# Patient Record
Sex: Male | Born: 1960 | Race: White | Hispanic: No | Marital: Single | Smoking: Never smoker
Health system: Southern US, Academic
[De-identification: ages and names within clinical notes are randomized; demographics above are authoritative.]

---

## 2022-02-19 ENCOUNTER — Emergency Department (HOSPITAL_COMMUNITY): Payer: Worker's Comp, Other unspecified | Admitting: Radiology

## 2022-02-19 ENCOUNTER — Emergency Department (EMERGENCY_DEPARTMENT_HOSPITAL): Payer: Worker's Comp, Other unspecified

## 2022-02-19 ENCOUNTER — Emergency Department (EMERGENCY_DEPARTMENT_HOSPITAL): Payer: Worker's Comp, Other unspecified | Admitting: Radiology

## 2022-02-19 ENCOUNTER — Emergency Department (HOSPITAL_COMMUNITY): Payer: Worker's Comp, Other unspecified

## 2022-02-19 ENCOUNTER — Inpatient Hospital Stay
Admission: EM | Admit: 2022-02-19 | Discharge: 2022-02-23 | DRG: 199 | Disposition: A | Discharge status: Home / Self Care | Payer: Worker's Comp, Other unspecified | Source: Ambulatory Visit | Attending: SURGICAL CRITICAL CARE | Admitting: SURGICAL CRITICAL CARE

## 2022-02-19 ENCOUNTER — Inpatient Hospital Stay (HOSPITAL_COMMUNITY): Payer: Self-pay | Admitting: Surgical Critical Care

## 2022-02-19 DIAGNOSIS — S27321A Contusion of lung, unilateral, initial encounter: Secondary | ICD-10-CM | POA: Diagnosis present

## 2022-02-19 DIAGNOSIS — I10 Essential (primary) hypertension: Secondary | ICD-10-CM | POA: Diagnosis present

## 2022-02-19 DIAGNOSIS — S270XXA Traumatic pneumothorax, initial encounter: Principal | ICD-10-CM | POA: Diagnosis present

## 2022-02-19 DIAGNOSIS — S59901A Unspecified injury of right elbow, initial encounter: Secondary | ICD-10-CM

## 2022-02-19 DIAGNOSIS — S272XXA Traumatic hemopneumothorax, initial encounter: Secondary | ICD-10-CM

## 2022-02-19 DIAGNOSIS — X58XXXA Exposure to other specified factors, initial encounter: Secondary | ICD-10-CM

## 2022-02-19 DIAGNOSIS — S0083XA Contusion of other part of head, initial encounter: Secondary | ICD-10-CM

## 2022-02-19 DIAGNOSIS — E041 Nontoxic single thyroid nodule: Secondary | ICD-10-CM

## 2022-02-19 DIAGNOSIS — S2241XA Multiple fractures of ribs, right side, initial encounter for closed fracture: Secondary | ICD-10-CM

## 2022-02-19 DIAGNOSIS — R220 Localized swelling, mass and lump, head: Secondary | ICD-10-CM

## 2022-02-19 DIAGNOSIS — S299XXA Unspecified injury of thorax, initial encounter: Secondary | ICD-10-CM

## 2022-02-19 DIAGNOSIS — S4991XA Unspecified injury of right shoulder and upper arm, initial encounter: Secondary | ICD-10-CM

## 2022-02-19 DIAGNOSIS — S8991XA Unspecified injury of right lower leg, initial encounter: Secondary | ICD-10-CM

## 2022-02-19 DIAGNOSIS — S3993XA Unspecified injury of pelvis, initial encounter: Secondary | ICD-10-CM

## 2022-02-19 DIAGNOSIS — H0589 Other disorders of orbit: Secondary | ICD-10-CM

## 2022-02-19 DIAGNOSIS — Q283 Other malformations of cerebral vessels: Secondary | ICD-10-CM

## 2022-02-19 DIAGNOSIS — J939 Pneumothorax, unspecified: Secondary | ICD-10-CM

## 2022-02-19 DIAGNOSIS — J982 Interstitial emphysema: Secondary | ICD-10-CM

## 2022-02-19 DIAGNOSIS — R918 Other nonspecific abnormal finding of lung field: Secondary | ICD-10-CM

## 2022-02-19 DIAGNOSIS — S3991XA Unspecified injury of abdomen, initial encounter: Secondary | ICD-10-CM

## 2022-02-19 DIAGNOSIS — S0003XA Contusion of scalp, initial encounter: Secondary | ICD-10-CM

## 2022-02-19 DIAGNOSIS — J439 Emphysema, unspecified: Secondary | ICD-10-CM

## 2022-02-19 DIAGNOSIS — S14109A Unspecified injury at unspecified level of cervical spinal cord, initial encounter: Secondary | ICD-10-CM

## 2022-02-19 DIAGNOSIS — N2889 Other specified disorders of kidney and ureter: Secondary | ICD-10-CM

## 2022-02-19 DIAGNOSIS — Z978 Presence of other specified devices: Secondary | ICD-10-CM

## 2022-02-19 DIAGNOSIS — K760 Fatty (change of) liver, not elsewhere classified: Secondary | ICD-10-CM

## 2022-02-19 DIAGNOSIS — S0101XA Laceration without foreign body of scalp, initial encounter: Secondary | ICD-10-CM

## 2022-02-19 DIAGNOSIS — J9811 Atelectasis: Secondary | ICD-10-CM

## 2022-02-19 DIAGNOSIS — S225XXA Flail chest, initial encounter for closed fracture: Secondary | ICD-10-CM | POA: Diagnosis present

## 2022-02-19 LAB — CBC WITH DIFF
BASOPHIL #: <0.1 10*3/uL (ref <=0.20)
BASOPHIL #: <0.1 10*3/uL (ref <=0.20)
BASOPHIL %: 1 %
BASOPHIL %: 0 %
EOSINOPHIL #: <0.1 10*3/uL (ref <=0.50)
EOSINOPHIL #: <0.1 10*3/uL (ref <=0.50)
EOSINOPHIL %: 1 %
EOSINOPHIL %: 0 %
HCT: 44.9 % (ref 38.9–52.0)
HCT: 38.7 % — ABNORMAL LOW (ref 38.9–52.0)
HGB: 14.7 g/dL (ref 13.4–17.5)
HGB: 13.1 g/dL — ABNORMAL LOW (ref 13.4–17.5)
IMMATURE GRANULOCYTE #: 0.12 10*3/uL — ABNORMAL HIGH (ref <0.10)
IMMATURE GRANULOCYTE #: <0.1 10*3/uL (ref <0.10)
IMMATURE GRANULOCYTE %: 1 % (ref 0–1)
IMMATURE GRANULOCYTE %: 1 % (ref 0–1)
LYMPHOCYTE #: 1.93 10*3/uL (ref 1.00–4.80)
LYMPHOCYTE #: 1.13 10*3/uL (ref 1.00–4.80)
LYMPHOCYTE %: 14 %
LYMPHOCYTE %: 10 %
MCH: 30.7 pg (ref 26.0–32.0)
MCH: 31.4 pg (ref 26.0–32.0)
MCHC: 32.7 g/dL (ref 31.0–35.5)
MCHC: 33.9 g/dL (ref 31.0–35.5)
MCV: 93.7 fL (ref 78.0–100.0)
MCV: 92.8 fL (ref 78.0–100.0)
MONOCYTE #: 1.18 10*3/uL — ABNORMAL HIGH (ref 0.20–1.10)
MONOCYTE #: 1.13 10*3/uL — ABNORMAL HIGH (ref 0.20–1.10)
MONOCYTE %: 8 %
MONOCYTE %: 10 %
MPV: 9.7 fL (ref 8.7–12.5)
MPV: 9.6 fL (ref 8.7–12.5)
NEUTROPHIL #: 10.69 10*3/uL — ABNORMAL HIGH (ref 1.50–7.70)
NEUTROPHIL #: 9.5 10*3/uL — ABNORMAL HIGH (ref 1.50–7.70)
NEUTROPHIL %: 75 %
NEUTROPHIL %: 79 %
PLATELETS: 199 10*3/uL (ref 150–400)
PLATELETS: 177 10*3/uL (ref 150–400)
RBC: 4.79 10*6/uL (ref 4.50–6.10)
RBC: 4.17 10*6/uL — ABNORMAL LOW (ref 4.50–6.10)
RDW-CV: 12.5 % (ref 11.5–15.5)
RDW-CV: 12.5 % (ref 11.5–15.5)
WBC: 14.1 10*3/uL — ABNORMAL HIGH (ref 3.7–11.0)
WBC: 11.9 10*3/uL — ABNORMAL HIGH (ref 3.7–11.0)

## 2022-02-19 LAB — BASIC METABOLIC PANEL
ANION GAP: 9 mmol/L (ref 4–13)
BUN/CREA RATIO: 17 (ref 6–22)
BUN: 19 mg/dL (ref 8–25)
CALCIUM: 8.7 mg/dL — ABNORMAL LOW (ref 8.8–10.2)
CHLORIDE: 109 mmol/L (ref 96–111)
CO2 TOTAL: 21 mmol/L — ABNORMAL LOW (ref 23–31)
CREATININE: 1.09 mg/dL (ref 0.75–1.35)
ESTIMATED GFR: 78 mL/min/BSA (ref >=60)
GLUCOSE: 153 mg/dL — ABNORMAL HIGH (ref 65–125)
POTASSIUM: 4.6 mmol/L (ref 3.5–5.1)
SODIUM: 139 mmol/L (ref 136–145)

## 2022-02-19 LAB — DRUG SCREEN, NO CONFIRMATION, URINE
AMPHETAMINES, URINE: NEGATIVE
BARBITURATES URINE: NEGATIVE
BENZODIAZEPINES URINE: NEGATIVE
BUPRENORPHINE URINE: NEGATIVE
CANNABINOIDS URINE: NEGATIVE
COCAINE METABOLITES URINE: NEGATIVE
CREATININE RANDOM URINE: 94 mg/dL (ref 50–100)
ECSTASY/MDMA URINE: NEGATIVE
FENTANYL, RANDOM URINE: POSITIVE — AB
METHADONE URINE: NEGATIVE
OPIATES URINE (LOW CUTOFF): NEGATIVE
OXYCODONE URINE: NEGATIVE

## 2022-02-19 LAB — BLOOD GAS W/ CO-OX, LYTES, LACTATE REFLEX
%FIO2 (VENOUS): 21 %
BASE DEFICIT: 1.9 mmol/L (ref <=3.0)
BICARBONATE (VENOUS): 23 mmol/L (ref 22.0–29.0)
CARBOXYHEMOGLOBIN: 2 % (ref <=3.0)
CHLORIDE: 105 mmol/L (ref 98–107)
GLUCOSE: 154 mg/dL — ABNORMAL HIGH (ref 65–125)
HEMOGLOBIN: 15.2 g/dL (ref 12.0–18.0)
IONIZED CALCIUM: 1.2 mmol/L (ref 1.15–1.33)
LACTATE: 1.2 mmol/L (ref <=1.9)
MET-HEMOGLOBIN: <0.7 % (ref <=1.5)
O2 SATURATION (VENOUS): 83.2 %
O2CT: 17.4 %
OXYHEMOGLOBIN: 81.7 %
PCO2 (VENOUS): 48 mm/Hg (ref 41–51)
PH (VENOUS): 7.32 (ref 7.32–7.43)
PO2 (VENOUS): 52 mm/Hg
SODIUM: 132 mmol/L — ABNORMAL LOW (ref 136–145)
WHOLE BLOOD POTASSIUM: 4.5 mmol/L (ref 3.5–5.1)

## 2022-02-19 LAB — ETHANOL, SERUM/PLASMA
ETHANOL: <10 mg/dL (ref <10)
ETHANOL: NOT DETECTED

## 2022-02-19 LAB — URINALYSIS, MICROSCOPIC
RBCS: 1 /hpf (ref <6.0)
WBCS: 1 /hpf (ref <4.0)

## 2022-02-19 LAB — URINALYSIS, MACROSCOPIC
BILIRUBIN: NEGATIVE mg/dL
COLOR: NORMAL
GLUCOSE: 100 mg/dL — AB
KETONES: NEGATIVE mg/dL
LEUKOCYTES: NEGATIVE WBCs/uL
NITRITE: NEGATIVE
PH: 5 (ref 5.0–8.0)
PROTEIN: NEGATIVE mg/dL
SPECIFIC GRAVITY: 1.047 — ABNORMAL HIGH (ref 1.005–1.030)
UROBILINOGEN: NEGATIVE mg/dL

## 2022-02-19 LAB — TYPE AND SCREEN
ABO/RH(D): O POS
ANTIBODY SCREEN: NEGATIVE

## 2022-02-19 LAB — ECG 12-LEAD
Atrial Rate: 91 {beats}/min
Calculated P Axis: 55 degrees
Calculated R Axis: 43 degrees
Calculated T Axis: 61 degrees
PR Interval: 156 ms
QRS Duration: 88 ms
QT Interval: 360 ms
QTC Calculation: 442 ms
Ventricular rate: 91 {beats}/min

## 2022-02-19 LAB — PTT (PARTIAL THROMBOPLASTIN TIME): APTT: 27.5 seconds (ref 24.2–37.5)

## 2022-02-19 LAB — PT/INR
INR: 1.03 (ref 0.80–1.20)
PROTHROMBIN TIME: 11.9 seconds (ref 9.1–13.9)

## 2022-02-19 LAB — DARK GREEN TUBE

## 2022-02-19 MED ORDER — KETAMINE 10 MG/ML INJECTION WRAPPER
35.0000 mg | INTRAMUSCULAR | Status: AC
Start: 2022-02-19 — End: 2022-02-19
  Administered 2022-02-19: 35 mg via INTRAVENOUS

## 2022-02-19 MED ORDER — SENNOSIDES 8.6 MG-DOCUSATE SODIUM 50 MG TABLET
1.0000 | ORAL_TABLET | Freq: Two times a day (BID) | ORAL | Status: DC
Start: 2022-02-19 — End: 2022-02-23
  Administered 2022-02-19 – 2022-02-23 (×8): 1 via ORAL
  Filled 2022-02-19 (×8): qty 1

## 2022-02-19 MED ORDER — SODIUM CHLORIDE 0.9% FLUSH BAG - 250 ML
INTRAVENOUS | Status: DC | PRN
Start: 2022-02-19 — End: 2022-02-22

## 2022-02-19 MED ORDER — SODIUM CHLORIDE 0.9% FLUSH BAG - 250 ML
INTRAVENOUS | Status: DC | PRN
Start: 2022-02-19 — End: 2022-02-23

## 2022-02-19 MED ORDER — SODIUM CHLORIDE 0.9 % (FLUSH) INJECTION SYRINGE
2.0000 mL | INJECTION | INTRAMUSCULAR | Status: DC | PRN
Start: 2022-02-19 — End: 2022-02-22

## 2022-02-19 MED ORDER — FENTANYL (PF) 50 MCG/ML INJECTION SOLUTION
INTRAMUSCULAR | Status: AC
Start: 2022-02-19 — End: 2022-02-19
  Administered 2022-02-19: 50 ug via INTRAVENOUS
  Filled 2022-02-19: qty 2

## 2022-02-19 MED ORDER — METHOCARBAMOL 500 MG TABLET
500.0000 mg | ORAL_TABLET | Freq: Four times a day (QID) | ORAL | Status: DC
Start: 2022-02-19 — End: 2022-02-23
  Administered 2022-02-19 – 2022-02-23 (×16): 500 mg via ORAL
  Filled 2022-02-19 (×16): qty 1

## 2022-02-19 MED ORDER — FENTANYL (PF) 50 MCG/ML INJECTION SOLUTION
50.0000 ug | INTRAMUSCULAR | Status: AC
Start: 2022-02-19 — End: 2022-02-19
  Administered 2022-02-19: 50 ug via INTRAVENOUS

## 2022-02-19 MED ORDER — ENOXAPARIN 30 MG/0.3 ML SUBCUTANEOUS SYRINGE
30.0000 mg | INJECTION | Freq: Two times a day (BID) | SUBCUTANEOUS | Status: DC
Start: 2022-02-19 — End: 2022-02-23
  Administered 2022-02-19 – 2022-02-23 (×8): 30 mg via SUBCUTANEOUS
  Filled 2022-02-19 (×8): qty 0.3

## 2022-02-19 MED ORDER — SODIUM CHLORIDE 0.9 % (FLUSH) INJECTION SYRINGE
2.0000 mL | INJECTION | Freq: Three times a day (TID) | INTRAMUSCULAR | Status: DC
Start: 2022-02-19 — End: 2022-02-22
  Administered 2022-02-19: 6 mL
  Administered 2022-02-19 – 2022-02-20 (×2): 5 mL
  Administered 2022-02-20: 6 mL
  Administered 2022-02-20 – 2022-02-21 (×2): 5 mL
  Administered 2022-02-21: 0 mL
  Administered 2022-02-21 – 2022-02-22 (×2): 5 mL

## 2022-02-19 MED ORDER — DEXTROSE 5% IN WATER (D5W) FLUSH BAG - 250 ML
INTRAVENOUS | Status: DC | PRN
Start: 2022-02-19 — End: 2022-02-23

## 2022-02-19 MED ORDER — OXYCODONE 10 MG TABLET
10.0000 mg | ORAL_TABLET | ORAL | Status: DC | PRN
Start: 2022-02-19 — End: 2022-02-23
  Administered 2022-02-19 – 2022-02-22 (×12): 10 mg via ORAL
  Filled 2022-02-19 (×12): qty 1

## 2022-02-19 MED ORDER — ACETAMINOPHEN 325 MG TABLET
650.0000 mg | ORAL_TABLET | Freq: Four times a day (QID) | ORAL | Status: DC
Start: 2022-02-19 — End: 2022-02-23
  Administered 2022-02-19 – 2022-02-22 (×13): 650 mg via ORAL
  Administered 2022-02-23: 0 mg via ORAL
  Administered 2022-02-23 (×2): 650 mg via ORAL
  Filled 2022-02-19 (×16): qty 2

## 2022-02-19 MED ORDER — SODIUM CHLORIDE 0.9 % (FLUSH) INJECTION SYRINGE
2.0000 mL | INJECTION | Freq: Three times a day (TID) | INTRAMUSCULAR | Status: DC
Start: 2022-02-19 — End: 2022-02-23
  Administered 2022-02-19: 6 mL
  Administered 2022-02-20: 5 mL
  Administered 2022-02-20: 6 mL
  Administered 2022-02-20 – 2022-02-21 (×2): 5 mL
  Administered 2022-02-21: 0 mL
  Administered 2022-02-21 – 2022-02-22 (×2): 5 mL
  Administered 2022-02-22: 6 mL
  Administered 2022-02-22: 2 mL
  Administered 2022-02-23: 5 mL
  Administered 2022-02-23: 6 mL

## 2022-02-19 MED ORDER — OXYCODONE 5 MG TABLET
5.0000 mg | ORAL_TABLET | ORAL | Status: DC | PRN
Start: 2022-02-19 — End: 2022-02-23
  Administered 2022-02-20 – 2022-02-23 (×3): 5 mg via ORAL
  Filled 2022-02-19 (×3): qty 1

## 2022-02-19 MED ORDER — ONDANSETRON HCL (PF) 4 MG/2 ML INJECTION SOLUTION
4.0000 mg | Freq: Three times a day (TID) | INTRAMUSCULAR | Status: DC | PRN
Start: 2022-02-19 — End: 2022-02-23

## 2022-02-19 MED ORDER — IOPAMIDOL 370 MG IODINE/ML (76 %) INTRAVENOUS SOLUTION
100.0000 mL | INTRAVENOUS | Status: AC
Start: 2022-02-19 — End: 2022-02-19
  Administered 2022-02-19: 100 mL via INTRAVENOUS

## 2022-02-19 MED ORDER — DEXTROSE 5% IN WATER (D5W) FLUSH BAG - 250 ML
INTRAVENOUS | Status: DC | PRN
Start: 2022-02-19 — End: 2022-02-22

## 2022-02-19 MED ORDER — ELECTROLYTE-A INTRAVENOUS SOLUTION
INTRAVENOUS | Status: DC
Start: 2022-02-19 — End: 2022-02-20
  Administered 2022-02-20: 0 mL via INTRAVENOUS

## 2022-02-19 MED ORDER — SODIUM CHLORIDE 0.9 % (FLUSH) INJECTION SYRINGE
2.0000 mL | INJECTION | INTRAMUSCULAR | Status: DC | PRN
Start: 2022-02-19 — End: 2022-02-23

## 2022-02-19 MED ORDER — FENTANYL (PF) 50 MCG/ML INJECTION SOLUTION
50.0000 ug | INTRAMUSCULAR | Status: AC
Start: 2022-02-19 — End: 2022-02-19
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
## 2022-02-19 NOTE — ED Nurses Note (Signed)
Report called. XR at bedside. To be transported upon completion.
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
## 2022-02-19 NOTE — Procedures (Signed)
[facility]  Chest Tube Insertion Procedure     Procedure Date:  02/19/2022  Time:  1430  Procedure: right Chest Tube Placement  Indication: Pneumothorax    Description:  Informed consent was obtained and a surgical time out was performed to confirm the correct patient and procedure.  The patient was positioned and then ketamine was used for anesthesia.  The site was identified and prepped in the usual sterile fashion.  The skin was prepped with chlorhexidine and 10 cc of 1% lidocaine was used for analgesia for the skin, subcutaneous tissue and pleural lining.  An incision was made over the right 5th intercostal space, in the region between the mid-axillary and mid-clavicular lines.  The pleural cavity was entered by finger dissection with an immediate rush of air.  A 28 Fr chest tube was placed under direct vision into the pleural space.  The chest tube was connected to -20 cmH2O of wet suction.  There was no blood output in the chest tube. The tube was sutured and dressed in sterile fashion.  CXR was ordered for confirmation of placement for which it was noted that the chest tube was mal-positioned. A new chest tube was then obtained and placed in a similar manner. New Chest X-ray showed the tube now placed approprietly in the chest. Patient tolerated procedure without complications.    Procedure was performed by Dr. Darrall Dears    Dr. Burna Sis was present, supervised and participated in the procedure.      Santina Evans, MD  02/19/2022, 19:32  Department of Surgery  PGY-5  Pager [phone]      I was present and supervised/observed the entire procedure.  Olam Idler, MD

## 2022-02-19 NOTE — Nurses Notes (Signed)
Clarified order for C-Spine precautions: logroll only. Patient was sitting up in bed upon this RN's initial assessment. Per Dr. Darrall Dears it is OK for the [facility] to be at 30 degrees. Nelida Gores, RN

## 2022-02-19 NOTE — ED Nurses Note (Signed)
XR at bedside
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
## 2022-02-19 NOTE — Nurses Notes (Signed)
"  [address]- this pt needs a code status and orders for his chest tube setting please. thanks, Lowella Bandy [phone]"  Text page sent to Nicolas nordstrom  [place] Maven Rosander, RN [phone]

## 2022-02-19 NOTE — Respiratory Therapy (Signed)
02/19/22 1435   Respiratory Parameters   Start Time 1435   1st Attempt 1.2 Liters   2nd Attempt 1.4 Liters   3rd Attempt 2.5 Liters   Average FVC 1.7 Liters   1st Attempt -60 cmH2O   2nd Attempt -55 cmH2O   3rd Attempt -60 cmH2O   Average NIF -58 cmH2O   Respiratory Effort Good   $Parameters (Resp only) Completed   Stop Time 1441   Duration 6 Minutes

## 2022-02-20 ENCOUNTER — Inpatient Hospital Stay (HOSPITAL_COMMUNITY): Payer: Worker's Comp, Other unspecified

## 2022-02-20 DIAGNOSIS — X58XXXA Exposure to other specified factors, initial encounter: Secondary | ICD-10-CM

## 2022-02-20 DIAGNOSIS — S2231XA Fracture of one rib, right side, initial encounter for closed fracture: Secondary | ICD-10-CM

## 2022-02-20 DIAGNOSIS — Z978 Presence of other specified devices: Secondary | ICD-10-CM

## 2022-02-20 DIAGNOSIS — T797XXA Traumatic subcutaneous emphysema, initial encounter: Secondary | ICD-10-CM

## 2022-02-20 DIAGNOSIS — R918 Other nonspecific abnormal finding of lung field: Secondary | ICD-10-CM

## 2022-02-20 DIAGNOSIS — I10 Essential (primary) hypertension: Secondary | ICD-10-CM

## 2022-02-20 DIAGNOSIS — S272XXA Traumatic hemopneumothorax, initial encounter: Secondary | ICD-10-CM

## 2022-02-20 LAB — BASIC METABOLIC PANEL
ANION GAP: 8 mmol/L (ref 4–13)
BUN/CREA RATIO: 16 (ref 6–22)
BUN: 15 mg/dL (ref 8–25)
CALCIUM: 8.1 mg/dL — ABNORMAL LOW (ref 8.8–10.2)
CHLORIDE: 106 mmol/L (ref 96–111)
CO2 TOTAL: 23 mmol/L (ref 23–31)
CREATININE: 0.96 mg/dL (ref 0.75–1.35)
ESTIMATED GFR: 90 mL/min/BSA (ref >=60)
GLUCOSE: 134 mg/dL — ABNORMAL HIGH (ref 65–125)
POTASSIUM: 4.3 mmol/L (ref 3.5–5.1)
SODIUM: 137 mmol/L (ref 136–145)

## 2022-02-20 LAB — CBC WITH DIFF
BASOPHIL #: <0.1 10*3/uL (ref <=0.20)
BASOPHIL %: 0 %
EOSINOPHIL #: <0.1 10*3/uL (ref <=0.50)
EOSINOPHIL %: 0 %
HCT: 36.6 % — ABNORMAL LOW (ref 38.9–52.0)
HGB: 12.2 g/dL — ABNORMAL LOW (ref 13.4–17.5)
IMMATURE GRANULOCYTE #: <0.1 10*3/uL (ref <0.10)
IMMATURE GRANULOCYTE %: 1 % (ref 0–1)
LYMPHOCYTE #: 1.4 10*3/uL (ref 1.00–4.80)
LYMPHOCYTE %: 15 %
MCH: 31.5 pg (ref 26.0–32.0)
MCHC: 33.3 g/dL (ref 31.0–35.5)
MCV: 94.6 fL (ref 78.0–100.0)
MONOCYTE #: 1.11 10*3/uL — ABNORMAL HIGH (ref 0.20–1.10)
MONOCYTE %: 12 %
MPV: 9.6 fL (ref 8.7–12.5)
NEUTROPHIL #: 6.46 10*3/uL (ref 1.50–7.70)
NEUTROPHIL %: 72 %
PLATELETS: 165 10*3/uL (ref 150–400)
RBC: 3.87 10*6/uL — ABNORMAL LOW (ref 4.50–6.10)
RDW-CV: 12.9 % (ref 11.5–15.5)
WBC: 9.1 10*3/uL (ref 3.7–11.0)

## 2022-02-20 LAB — PHOSPHORUS: PHOSPHORUS: 2.8 mg/dL (ref 2.3–4.0)

## 2022-02-20 LAB — MAGNESIUM: MAGNESIUM: 2.3 mg/dL (ref 1.8–2.6)

## 2022-02-20 MED ORDER — GABAPENTIN 300 MG CAPSULE
300.0000 mg | ORAL_CAPSULE | Freq: Three times a day (TID) | ORAL | Status: DC
Start: 2022-02-20 — End: 2022-02-23
  Administered 2022-02-20 – 2022-02-23 (×12): 300 mg via ORAL
  Filled 2022-02-20 (×12): qty 1

## 2022-02-20 MED ORDER — FAMOTIDINE 20 MG TABLET
20.0000 mg | ORAL_TABLET | Freq: Two times a day (BID) | ORAL | Status: DC
Start: 2022-02-20 — End: 2022-02-22
  Administered 2022-02-20 – 2022-02-22 (×4): 20 mg via ORAL
  Filled 2022-02-20 (×4): qty 1
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
## 2022-02-20 NOTE — Care Plan (Signed)
Care plan reviewed with patient. Plan for aggressive pulm toilet and increase activity. Pain controlled with oral medication. Patient tolerating regular diet without complications. Patient ambulating with standby assist. Fall precautions maintained. Discharge planning ongoing.     Problem: Adult Inpatient Plan of Care  Goal: Plan of Care Review  02/20/2022 1550 by Ansleigh Safer, [place], RN  Outcome: Ongoing (see interventions/notes)  Flowsheets (Taken 02/20/2022 1550)  Progress: improving  Plan of Care Reviewed With: patient  02/20/2022 1550 by Rebekah Sprinkle, [place], RN  Outcome: Ongoing (see interventions/notes)  Flowsheets (Taken 02/20/2022 1550)  Progress: improving  Plan of Care Reviewed With: patient  Goal: Patient-Specific Goal (Individualized)  Outcome: Ongoing (see interventions/notes)  Flowsheets (Taken 02/20/2022 1550)  Individualized Care Needs: splinted cough for rib fractures

## 2022-02-20 NOTE — Nurses Notes (Signed)
Patient requesting something more for pain relief, unable to get PRN oxycodone for another hour. Service paged. Nelida Gores, RN

## 2022-02-20 NOTE — Care Plan (Signed)
Chest tube has had 32 mL of sanguineous output overnight. Dressing is C/D/I. Patient reports after gabapentin was added, pain has been controlled. Fall risk precautions maintained, bed alarm is on. Nelida Gores, RN    Problem: Adult Inpatient Plan of Care  Goal: Plan of Care Review  Outcome: Ongoing (see interventions/notes)  Goal: Patient-Specific Goal (Individualized)  Outcome: Ongoing (see interventions/notes)  Goal: Absence of Hospital-Acquired Illness or Injury  Outcome: Ongoing (see interventions/notes)  Intervention: Identify and Manage Fall Risk  Recent Flowsheet Documentation  Taken 02/20/2022 0600 by Nelida Gores, RN  Safety Promotion/Fall Prevention: safety round/check completed  Taken 02/20/2022 0400 by Nelida Gores, RN  Safety Promotion/Fall Prevention: safety round/check completed  Taken 02/20/2022 0200 by Nelida Gores, RN  Safety Promotion/Fall Prevention: safety round/check completed  Taken 02/20/2022 0000 by Nelida Gores, RN  Safety Promotion/Fall Prevention: safety round/check completed  Taken 02/19/2022 2200 by Nelida Gores, RN  Safety Promotion/Fall Prevention: safety round/check completed  Taken 02/19/2022 2000 by Nelida Gores, RN  Safety Promotion/Fall Prevention: safety round/check completed  Intervention: Prevent Skin Injury  Recent Flowsheet Documentation  Taken 02/19/2022 2000 by Nelida Gores, RN  Body Position: positioned with 1 assist  Intervention: Prevent and Manage VTE (Venous Thromboembolism) Risk  Recent Flowsheet Documentation  Taken 02/19/2022 2000 by Nelida Gores, RN  VTE Prevention/Management:   ambulation promoted   anticoagulant therapy maintained   sequential compression device initiated  Goal: Optimal Comfort and Wellbeing  Outcome: Ongoing (see interventions/notes)  Goal: Rounds/Family Conference  Outcome: Ongoing (see interventions/notes)     Problem: Fall Injury Risk  Goal: Absence of Fall and Fall-Related Injury  Outcome: Ongoing (see  interventions/notes)  Intervention: Promote Injury-Free Environment  Recent Flowsheet Documentation  Taken 02/20/2022 0600 by Nelida Gores, RN  Safety Promotion/Fall Prevention: safety round/check completed  Taken 02/20/2022 0400 by Nelida Gores, RN  Safety Promotion/Fall Prevention: safety round/check completed  Taken 02/20/2022 0200 by Nelida Gores, RN  Safety Promotion/Fall Prevention: safety round/check completed  Taken 02/20/2022 0000 by Nelida Gores, RN  Safety Promotion/Fall Prevention: safety round/check completed  Taken 02/19/2022 2200 by Nelida Gores, RN  Safety Promotion/Fall Prevention: safety round/check completed  Taken 02/19/2022 2000 by Nelida Gores, RN  Safety Promotion/Fall Prevention: safety round/check completed     Problem: Pain Acute  Goal: Optimal Pain Control and Function  Outcome: Ongoing (see interventions/notes)  Intervention: Prevent or Manage Pain  Recent Flowsheet Documentation  Taken 02/19/2022 2000 by Nelida Gores, RN  Bowel Elimination Promotion:   adequate fluid intake promoted   ambulation promoted     Problem: Orthopaedic Fracture  Goal: Absence of Bleeding  Outcome: Ongoing (see interventions/notes)  Goal: Effective Bowel Elimination  Outcome: Ongoing (see interventions/notes)  Intervention: Promote Effective Bowel Elimination  Recent Flowsheet Documentation  Taken 02/19/2022 2000 by Nelida Gores, RN  Bowel Elimination Promotion:   adequate fluid intake promoted   ambulation promoted  Goal: Absence of Embolism Signs and Symptoms  Outcome: Ongoing (see interventions/notes)  Intervention: Prevent or Manage Embolism Risk  Recent Flowsheet Documentation  Taken 02/19/2022 2000 by Nelida Gores, RN  VTE Prevention/Management:   ambulation promoted   anticoagulant therapy maintained   sequential compression device initiated  Goal: Optimal Functional Ability  Outcome: Ongoing (see interventions/notes)  Intervention: Optimize Functional Ability  Recent  Flowsheet Documentation  Taken 02/19/2022 2000 by Nelida Gores, RN  Positioning/Transfer Devices:   pillows   in use  Goal: Optimal Pain Control and Function  Outcome: Ongoing (see interventions/notes)  Goal: Effective Oxygenation and Ventilation  Outcome:  Ongoing (see interventions/notes)  Intervention: Promote Airway Secretion Clearance  Recent Flowsheet Documentation  Taken 02/19/2022 2000 by Nelida Gores, RN  Cough And Deep Breathing: done with encouragement  Intervention: Optimize Oxygenation and Ventilation  Recent Flowsheet Documentation  Taken 02/19/2022 2000 by Nelida Gores, RN  Head of Bed [facility]) Positioning: 30 degrees

## 2022-02-20 NOTE — Ancillary Notes (Signed)
SBIRT    SBIRT completed, which includes mental and substance use/abuse questions, as per [facility] SBIRT model.  Pending recommendations:  None.    Bishop Dublin, [facility] Clinical Therapist 02/20/2022, 11:54    Pager  [phone]    ASSIST Score per [facility] SBIRT model: 0
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
## 2022-02-20 NOTE — Respiratory Therapy (Signed)
02/20/22 0827   Respiratory Parameters   Start Time 0827   1st Attempt 1.59 Liters   2nd Attempt 1.62 Liters   3rd Attempt 1.53 Liters   Average FVC 1.6 Liters   1st Attempt -60 cmH2O   2nd Attempt -60 cmH2O   3rd Attempt -60 cmH2O   Average NIF -60 cmH2O   Respiratory Effort Good   $Parameters (Resp only) Completed   Stop Time 0833   Duration 6 Minutes     Pt currently on 2L NC with saturations WNL. BBS are diminished. Pt performed 1250 on IS and has vpep at bedside. Pt is in no apparent respiratory distress at this time. RT will continue to monitor.

## 2022-02-20 NOTE — Respiratory Therapy (Signed)
02/20/22 1425   Pre-Tx   Start Time 1425   FVC   1st Attempt 1.76 Liters   2nd Attempt 1.73 Liters   3rd Attempt 1.66 Liters   Average FVC 1.7 Liters   NIF   1st Attempt -60 cmH2O   2nd Attempt -60 cmH2O   3rd Attempt -60 cmH2O   Average NIF -60 cmH2O   Patient Effort   Respiratory Effort Good   $Parameters (Resp only) Completed   Stop Time 1428   Duration 3 Minutes
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
## 2022-02-21 ENCOUNTER — Inpatient Hospital Stay (HOSPITAL_COMMUNITY): Payer: Worker's Comp, Other unspecified | Admitting: Radiology

## 2022-02-21 ENCOUNTER — Inpatient Hospital Stay (HOSPITAL_COMMUNITY): Payer: Worker's Comp, Other unspecified

## 2022-02-21 DIAGNOSIS — T1490XA Injury, unspecified, initial encounter: Secondary | ICD-10-CM

## 2022-02-21 DIAGNOSIS — S2249XA Multiple fractures of ribs, unspecified side, initial encounter for closed fracture: Secondary | ICD-10-CM

## 2022-02-21 LAB — BASIC METABOLIC PANEL
ANION GAP: 4 mmol/L (ref 4–13)
BUN/CREA RATIO: 16 (ref 6–22)
BUN: 13 mg/dL (ref 8–25)
CALCIUM: 8.4 mg/dL — ABNORMAL LOW (ref 8.8–10.2)
CHLORIDE: 102 mmol/L (ref 96–111)
CO2 TOTAL: 27 mmol/L (ref 23–31)
CREATININE: 0.83 mg/dL (ref 0.75–1.35)
ESTIMATED GFR: >90 mL/min/BSA (ref >=60)
GLUCOSE: 148 mg/dL — ABNORMAL HIGH (ref 65–125)
POTASSIUM: 4 mmol/L (ref 3.5–5.1)
SODIUM: 133 mmol/L — ABNORMAL LOW (ref 136–145)

## 2022-02-21 LAB — CBC WITH DIFF
BASOPHIL #: <0.1 10*3/uL (ref <=0.20)
BASOPHIL %: 0 %
EOSINOPHIL #: <0.1 10*3/uL (ref <=0.50)
EOSINOPHIL %: 0 %
HCT: 37.1 % — ABNORMAL LOW (ref 38.9–52.0)
HGB: 12.3 g/dL — ABNORMAL LOW (ref 13.4–17.5)
IMMATURE GRANULOCYTE #: <0.1 10*3/uL (ref <0.10)
IMMATURE GRANULOCYTE %: 0 % (ref 0–1)
LYMPHOCYTE #: 0.94 10*3/uL — ABNORMAL LOW (ref 1.00–4.80)
LYMPHOCYTE %: 10 %
MCH: 31.3 pg (ref 26.0–32.0)
MCHC: 33.2 g/dL (ref 31.0–35.5)
MCV: 94.4 fL (ref 78.0–100.0)
MONOCYTE #: 0.8 10*3/uL (ref 0.20–1.10)
MONOCYTE %: 9 %
MPV: 9.9 fL (ref 8.7–12.5)
NEUTROPHIL #: 7.33 10*3/uL (ref 1.50–7.70)
NEUTROPHIL %: 81 %
PLATELETS: 143 10*3/uL — ABNORMAL LOW (ref 150–400)
RBC: 3.93 10*6/uL — ABNORMAL LOW (ref 4.50–6.10)
RDW-CV: 12.6 % (ref 11.5–15.5)
WBC: 9.1 10*3/uL (ref 3.7–11.0)

## 2022-02-21 MED ORDER — IPRATROPIUM 0.5 MG-ALBUTEROL 3 MG (2.5 MG BASE)/3 ML NEBULIZATION SOLN
3.0000 mL | INHALATION_SOLUTION | Freq: Four times a day (QID) | RESPIRATORY_TRACT | Status: DC
Start: 2022-02-21 — End: 2022-02-23
  Administered 2022-02-21 – 2022-02-23 (×8): 3 mL via RESPIRATORY_TRACT
  Filled 2022-02-21 (×7): qty 3

## 2022-02-21 NOTE — Care Plan (Signed)
Plan of care reviewed with patient. OOB with standby. Tolerating regular diet well. Voiding adequate amounts of urine without difficulty. LBM 02/19/22 (PTA) . VTE prophylaxis with medication, ambulation, and scd use. Pain control managed. Fall precautions maintained.     Problem: Adult Inpatient Plan of Care  Goal: Plan of Care Review  Outcome: Ongoing (see interventions/notes)  Flowsheets (Taken 02/21/2022 0414)  Progress: no change  Plan of Care Reviewed With: patient     Problem: Adult Inpatient Plan of Care  Goal: Absence of Hospital-Acquired Illness or Injury  Intervention: Identify and Manage Fall Risk  Recent Flowsheet Documentation  Taken 02/20/2022 2000 by Estevan Ryder, RN  Safety Promotion/Fall Prevention:   safety round/check completed   fall prevention program maintained   motion sensor pad activated  Intervention: Prevent Skin Injury  Recent Flowsheet Documentation  Taken 02/20/2022 2000 by Estevan Ryder, RN  Body Position: supine, head elevated  Skin Protection:   adhesive use limited   transparent dressing maintained   tubing/devices free from skin contact  Intervention: Prevent and Manage VTE (Venous Thromboembolism) Risk  Recent Flowsheet Documentation  Taken 02/20/2022 2000 by Estevan Ryder, RN  VTE Prevention/Management:   ambulation promoted   anticoagulant therapy maintained   dorsiflexion/plantar flexion performed   sequential compression devices on  Intervention: Prevent Infection  Recent Flowsheet Documentation  Taken 02/20/2022 2000 by Estevan Ryder, RN  Infection Prevention:   barrier precautions utilized   promote handwashing   rest/sleep promoted   single patient room provided  Goal: Optimal Comfort and Wellbeing  Intervention: Provide Person-Centered Care  Recent Flowsheet Documentation  Taken 02/20/2022 2000 by Estevan Ryder, RN  Trust Relationship/Rapport:   care explained   choices provided   emotional support provided   empathic listening provided   questions answered   questions encouraged    reassurance provided   thoughts/feelings acknowledged     Problem: Fall Injury Risk  Goal: Absence of Fall and Fall-Related Injury  Intervention: Promote Injury-Free Environment  Recent Flowsheet Documentation  Taken 02/20/2022 2000 by Estevan Ryder, RN  Safety Promotion/Fall Prevention:   safety round/check completed   fall prevention program maintained   motion sensor pad activated     Problem: Pain Acute  Goal: Optimal Pain Control and Function  Intervention: Prevent or Manage Pain  Recent Flowsheet Documentation  Taken 02/20/2022 2000 by Estevan Ryder, RN  Bowel Elimination Promotion:   adequate fluid intake promoted   ambulation promoted   commode/bedpan at bedside  Intervention: Optimize Psychosocial Wellbeing  Recent Flowsheet Documentation  Taken 02/20/2022 2000 by Estevan Ryder, RN  Diversional Activities: television     Problem: Orthopaedic Fracture  Goal: Effective Bowel Elimination  Intervention: Promote Effective Bowel Elimination  Recent Flowsheet Documentation  Taken 02/20/2022 2000 by Estevan Ryder, RN  Bowel Elimination Promotion:   adequate fluid intake promoted   ambulation promoted   commode/bedpan at bedside  Goal: Absence of Embolism Signs and Symptoms  Intervention: Prevent or Manage Embolism Risk  Recent Flowsheet Documentation  Taken 02/20/2022 2000 by Estevan Ryder, RN  VTE Prevention/Management:   ambulation promoted   anticoagulant therapy maintained   dorsiflexion/plantar flexion performed   sequential compression devices on  Goal: Optimal Functional Ability  Intervention: Optimize Functional Ability  Recent Flowsheet Documentation  Taken 02/20/2022 2000 by Estevan Ryder, RN  Activity Management: ROM, active encouraged  Positioning/Transfer Devices:   pillows   in use  Goal: Effective Oxygenation and Ventilation  Intervention: Promote Airway Secretion Clearance  Recent Flowsheet Documentation  Taken  02/20/2022 2000 by Estevan Ryder, RN  Cough And Deep Breathing: done with encouragement  Activity Management:  ROM, active encouraged  Intervention: Optimize Oxygenation and Ventilation  Recent Flowsheet Documentation  Taken 02/20/2022 2000 by Estevan Ryder, RN  Head of Bed [facility] 352) Positioning: HOB at 30-45 degrees    Estevan Ryder, RN

## 2022-02-21 NOTE — Care Plan (Signed)
Patient OOB with standby to 1 assist. Tolerating regular diet but has a poor appetite. Voiding regularly, no bowel movement since admission.     Problem: Adult Inpatient Plan of Care  Goal: Patient-Specific Goal (Individualized)  Recent Flowsheet Documentation  Taken 02/21/2022 0817 by Zenia Resides, RN  Individualized Care Needs: splinted cough for rib fractures  Anxieties, Fears or Concerns: concerned about pain  Plan of Care Reviewed With: patient  Goal: Absence of Hospital-Acquired Illness or Injury  Intervention: Identify and Manage Fall Risk  Recent Flowsheet Documentation  Taken 02/21/2022 1600 by Zenia Resides, RN  Safety Promotion/Fall Prevention:   activity supervised   fall prevention program maintained   motion sensor pad activated   nonskid shoes/slippers when out of bed   safety round/check completed  Taken 02/21/2022 1400 by Zenia Resides, RN  Safety Promotion/Fall Prevention:   activity supervised   fall prevention program maintained   motion sensor pad activated   nonskid shoes/slippers when out of bed   safety round/check completed  Taken 02/21/2022 1215 by Zenia Resides, RN  Safety Promotion/Fall Prevention:   activity supervised   fall prevention program maintained   safety round/check completed   nonskid shoes/slippers when out of bed   motion sensor pad activated  Taken 02/21/2022 1011 by Zenia Resides, RN  Safety Promotion/Fall Prevention:   activity supervised   safety round/check completed  Taken 02/21/2022 0817 by Zenia Resides, RN  Safety Promotion/Fall Prevention:   safety round/check completed   activity supervised  Intervention: Prevent Skin Injury  Recent Flowsheet Documentation  Taken 02/21/2022 0817 by Zenia Resides, RN  Body Position: supine, head elevated  Intervention: Prevent and Manage VTE (Venous Thromboembolism) Risk  Recent Flowsheet Documentation  Taken 02/21/2022 0817 by Zenia Resides, RN  VTE Prevention/Management:   anticoagulant therapy maintained   ambulation  promoted  Goal: Optimal Comfort and Wellbeing  Intervention: Provide Person-Centered Care  Recent Flowsheet Documentation  Taken 02/21/2022 0817 by Zenia Resides, RN  Trust Relationship/Rapport:   care explained   choices provided   emotional support provided   empathic listening provided   questions answered   questions encouraged   reassurance provided   thoughts/feelings acknowledged

## 2022-02-21 NOTE — Respiratory Therapy (Signed)
02/21/22 1340   Pre-Tx   Start Time 1340   FVC   1st Attempt 1.49 Liters   2nd Attempt 1.82 Liters   3rd Attempt 1.74 Liters   Average FVC 1.7 Liters   NIF   1st Attempt -60 cmH2O   2nd Attempt -60 cmH2O   3rd Attempt -60 cmH2O   Average NIF -60 cmH2O   Patient Effort   Respiratory Effort Good   $Parameters (Resp only) Completed   Stop Time 1343   Duration 3 Minutes

## 2022-02-21 NOTE — Respiratory Therapy (Signed)
Pulmonary Evaluation    Patient resting on 2L nasal cannula. Patient refusing pulmonary toilet today. RT educated patient on the importance of using IS and vpep. Will continue to monitor.
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
## 2022-02-22 ENCOUNTER — Inpatient Hospital Stay (HOSPITAL_COMMUNITY): Payer: Worker's Comp, Other unspecified

## 2022-02-22 DIAGNOSIS — S8991XA Unspecified injury of right lower leg, initial encounter: Secondary | ICD-10-CM

## 2022-02-22 DIAGNOSIS — S2249XA Multiple fractures of ribs, unspecified side, initial encounter for closed fracture: Secondary | ICD-10-CM

## 2022-02-22 LAB — BASIC METABOLIC PANEL
ANION GAP: 6 mmol/L (ref 4–13)
BUN/CREA RATIO: 19 (ref 6–22)
BUN: 16 mg/dL (ref 8–25)
CALCIUM: 8.3 mg/dL — ABNORMAL LOW (ref 8.8–10.2)
CHLORIDE: 103 mmol/L (ref 96–111)
CO2 TOTAL: 28 mmol/L (ref 23–31)
CREATININE: 0.85 mg/dL (ref 0.75–1.35)
ESTIMATED GFR: >90 mL/min/BSA (ref >=60)
GLUCOSE: 127 mg/dL — ABNORMAL HIGH (ref 65–125)
POTASSIUM: 4.4 mmol/L (ref 3.5–5.1)
SODIUM: 137 mmol/L (ref 136–145)

## 2022-02-22 LAB — CBC WITH DIFF
BASOPHIL #: <0.1 10*3/uL (ref <=0.20)
BASOPHIL %: 0 %
EOSINOPHIL #: 0.11 10*3/uL (ref <=0.50)
EOSINOPHIL %: 1 %
HCT: 35.7 % — ABNORMAL LOW (ref 38.9–52.0)
HGB: 12 g/dL — ABNORMAL LOW (ref 13.4–17.5)
IMMATURE GRANULOCYTE #: <0.1 10*3/uL (ref <0.10)
IMMATURE GRANULOCYTE %: 1 % (ref 0–1)
LYMPHOCYTE #: 1.28 10*3/uL (ref 1.00–4.80)
LYMPHOCYTE %: 14 %
MCH: 31.6 pg (ref 26.0–32.0)
MCHC: 33.6 g/dL (ref 31.0–35.5)
MCV: 93.9 fL (ref 78.0–100.0)
MONOCYTE #: 0.96 10*3/uL (ref 0.20–1.10)
MONOCYTE %: 11 %
MPV: 10.3 fL (ref 8.7–12.5)
NEUTROPHIL #: 6.52 10*3/uL (ref 1.50–7.70)
NEUTROPHIL %: 73 %
PLATELETS: 164 10*3/uL (ref 150–400)
RBC: 3.8 10*6/uL — ABNORMAL LOW (ref 4.50–6.10)
RDW-CV: 12.6 % (ref 11.5–15.5)
WBC: 9 10*3/uL (ref 3.7–11.0)

## 2022-02-22 NOTE — Care Plan (Signed)
[facility]  Rehabilitation Services  Occupational Therapy Progress Note    Patient Name: Perry Davis  Date of Birth: [birth year]  Height:  175.3 cm (5\' 9" )  Weight:  109 kg (239 lb 13.8 oz)  Room/Bed: 774/A  Payor: PENDING AUTO INSURANCE INFO / Plan: PENDING AUTO INSURANCE INFO / Product Type: Auto /     Assessment:    Pt tolerated tx session well this date. Pt w/ good participation for addressing bed mobility, sitting/standing tolerance tasks, LB dressing tasks while supine, self-feeding tasks, transfer training, and functional mobility tolerance tasks. At this time it appears pt would benefit from continued care at home w/ assist upon d/c.      Discharge Needs:   Equipment Recommendation: none anticipated    Discharge Disposition:  home with assist     JUSTIFICATION OF DISCHARGE RECOMMENDATION   Based on current diagnosis, functional performance prior to admission, and current functional performance, this patient requires continued OT services in home with assist in order to achieve significant functional improvements.    Plan:   Continue to follow patient according to established plan of care.  The risks/benefits of therapy have been discussed with the patient/caregiver and he/she is in agreement with the established plan of care.     Subjective & Objective:      02/22/22 04/25/22   Therapist Pager   OT Assigned/ Pager # Kolyn Rozario [phone]. [phone]   Rehab Session   Document Type therapy progress note (daily note)   Total OT Minutes: 8   Patient Effort good   Symptoms Noted During/After Treatment fatigue;increased pain   General Information   Patient Profile Reviewed yes   Medical Lines PIV Line;Telemetry;Chest Tube   Respiratory Status nasal cannula   Existing Precautions/Restrictions full code;fall precautions   Pre Treatment Status   Pre Treatment Patient Status Patient supine in bed;Call light within reach;Telephone within reach;Sitter select activated;Nurse approved session   Support Present Pre Treatment  None    Communication Pre Treatment  Nurse   Communication Pre Treatment Comment RN approved   Mutuality/Individual Preferences   Individualized Care Needs OOB w/ Ax1   Vital Signs   Vitals Comment VSS   Pain Assessment   Pre/Posttreatment Pain Comment pt c/o 6/10 rib pain   Coping/Psychosocial Response Interventions   Plan Of Care Reviewed With patient   Mobility Assessment/Training   Mobility Comment not completed in hallway 2/2 to pt wanting to eat breakfast at this time; pt agreeable to transfer to chair at this time and completed ~51ft w/ CGA   Bed Mobility Assessment/Treatment   Bed Mobility, Assistive Device Head of Bed Elevated;bed rails   Supine-Sit Independence minimum assist (75% patient effort)   Sit to Supine, Independence not tested   Safety Issues decreased use of arms for pushing/pulling;impaired trunk control for bed mobility   Transfer Assessment/Treatment   Sit-Stand Independence contact guard assist   Stand-Sit Independence contact guard assist   Sit-Stand-Sit, Assist Device None   Bed-Chair Independence contact guard assist   Chair-Bed Independence not tested   Bed-Chair-Bed Assist Device none   Lower Body Dressing Assessment/Training   Position supine   Independence Level  maximum assist (25% patient effort)   Comment to adjust socks   Self-Feeding Assessment/Training   Comment SUA   Balance Skill Training   Sitting Balance: Static good balance   Sitting, Dynamic (Balance) good balance   Sit-to-Stand Balance fair + balance   Standing Balance: Static fair + balance   Standing Balance:  Dynamic fair balance   Post Treatment Status   Post Treatment Patient Status Patient sitting in bedside chair or w/c;Call light within reach;Telephone within reach;Armed forces logistics/support/administrative officer Treatment Comment pt performance   Clinical Impression   Functional Level at Time of Session Pt tolerated tx session well this date. Pt w/  good participation for addressing bed mobility, sitting/standing tolerance tasks, LB dressing tasks while supine, self-feeding tasks, transfer training, and functional mobility tolerance tasks. At this time it appears pt would benefit from continued care at home w/ assist upon d/c.   Anticipated Equipment Needs at Discharge none anticipated   Anticipated Discharge Disposition home with assist       Therapist:   Purcell Nails, Denman George  Pager #: [phone]

## 2022-02-22 NOTE — Care Management Notes (Signed)
CCC attempted to meet with pt at bedside today at 1148. Pt was receiving respiratory treatment. CCC will follow up shortly.
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
## 2022-02-22 NOTE — Respiratory Therapy (Signed)
patient on RA, doing his IS/PEP, receiving breathing tx

## 2022-02-22 NOTE — Discharge Instructions (Signed)
Prior to your Trauma follow up appointment we ask that you have a chest x-ray.  Please obtain this x-ray the same day as your appointment, this can be done on the 2nd floor of the Physician's Office Center. The Trauma Clinic is located on the 4th floor of the Physician's Office Center.  If you have any questions regarding this please contact the Trauma Department at [phone] ext [phone]. Thank you.

## 2022-02-22 NOTE — Care Plan (Signed)
[facility]  Rehabilitation Services  Physical Therapy Progress Note      Patient Name: Perry Davis  Date of Birth: Apr 11, 1961  Height:  175.3 cm (5\' 9" )  Weight:  109 kg (239 lb 13.8 oz)  Room/Bed: 774/A  Payor: PENDING AUTO INSURANCE INFO / Plan: PENDING AUTO INSURANCE INFO / Product Type: Auto /     Assessment:     Pt tol tx well today. Pt able to complete bed mobility w/ MinA 2/2 increased pain. Pt completed STS and transfer to bedside chair w/ CGA. Pt reported no dizziness today. Pt declined hallway amb today 2/2 receiving breakfast but provided education and encouragement to ambulate w/ staff later today. Anticipate pt will cont to progress and pt will be appropriate for safe d/c home w/ A when medically ready.    Discharge Needs:   Equipment Recommendation: none anticipated    Discharge Disposition: home with assist    JUSTIFICATION OF DISCHARGE RECOMMENDATION   Based on current diagnosis, functional performance prior to admission, and current functional performance, this patient requires continued PT services in home with assist in order to achieve significant functional improvements in these deficit areas:  .      Plan:   Continue to follow patient according to established plan of care.  The risks/benefits of therapy have been discussed with the patient/caregiver and he/she is in agreement with the established plan of care.     Subjective & Objective:        02/22/22 04/25/22   Therapist Pager   PT Assigned/ Pager # Steph [phone]   Rehab Session   Document Type therapy progress note (daily note)   Total PT Minutes: 8   Patient Effort good   Symptoms Noted During/After Treatment fatigue;increased pain   General Information   Patient Profile Reviewed yes   Medical Lines PIV Line;Chest Tube;Telemetry   Respiratory Status nasal cannula   Existing Precautions/Restrictions fall precautions;full code   General Observations of Patient Pt seen bedside w/ OT for tx. Pt alert and agreeable to tx.    Mutuality/Individual Preferences   Individualized Care Needs OOB w/ Ax1   Pre Treatment Status   Pre Treatment Patient Status Patient supine in bed;Call light within reach;Telephone within reach;Sitter select activated;Nurse approved session   Support Present Pre Treatment  Family present   Communication Pre Treatment  Nurse   Vital Signs   Vitals Comment VSS on NC   Pain Assessment   Pre/Posttreatment Pain Comment Pt reported 6/10 pain in ribs throughout   Bed Mobility Assessment/Treatment   Bed Mobility, Assistive Device Head of Bed Elevated;bed rails   Supine-Sit Independence minimum assist (75% patient effort)   Sit to Supine, Independence not tested   Safety Issues decreased use of arms for pushing/pulling;impaired trunk control for bed mobility   Comment Pt able to complete bed mobility w/ some MinA today 2/2 inc pain in ribs.   Transfer Assessment/Treatment   Sit-Stand Independence contact guard assist   Stand-Sit Independence contact guard assist   Sit-Stand-Sit, Assist Device None   Bed-Chair Independence contact guard assist   Transfer Comment Reported no dizziness today   Gait Assessment/Treatment   Independence  contact guard assist   Distance in Feet bed to chair, approx 5 steps   Comment declined hallway amb this morning 2/2 recieving breakfast. Educated on importance of amb throughout the Davis and encouraged pt to amb later w/ staff   Balance Skill Training   Comment CGA   Sitting Balance:  Static good balance   Sitting, Dynamic (Balance) good balance   Sit-to-Stand Balance fair + balance   Standing Balance: Static fair + balance   Standing Balance: Dynamic fair balance   Post Treatment Status   Post Treatment Patient Status Patient sitting in bedside chair or w/c;Call light within reach;Telephone within reach;Sitter select activated   Support Present Post Treatment  None   Basic Mobility Am-PAC/6Clicks Score (APPROVED Staff)   Turning in bed without bedrails 3   Lying on back to sitting on edge of  flat bed 3   Moving to and from a bed to a chair 3   Standing up from chair 4   Walk in room 4   Climbing 3-5 steps with railing 2   6 Clicks Raw Score total 19   Standardized (t-scale) score 42.48   Patient Mobility Goal (JHHLM) 4- Move to chair 3X/Davis   Exercise/Activity Level Performed 4- Transferred to chair/commode   Physical Therapy Clinical Impression   Assessment Pt tol tx well today. Pt able to complete bed mobility w/ MinA 2/2 increased pain. Pt completed STS and transfer to bedside chair w/ CGA. Pt reported no dizziness today. Pt declined hallway amb today 2/2 receiving breakfast but provided education and encouragement to ambulate w/ staff later today. Anticipate pt will cont to progress and pt will be appropriate for safe d/c home w/ A when medically ready.   Anticipated Equipment Needs at Discharge (PT) none anticipated   Anticipated Discharge Disposition home with assist         Therapist:   Lattie Haw, PTA   Pager #: [phone]

## 2022-02-23 ENCOUNTER — Inpatient Hospital Stay (HOSPITAL_COMMUNITY): Payer: Worker's Comp, Other unspecified

## 2022-02-23 ENCOUNTER — Other Ambulatory Visit: Payer: Self-pay

## 2022-02-23 DIAGNOSIS — S2249XA Multiple fractures of ribs, unspecified side, initial encounter for closed fracture: Secondary | ICD-10-CM

## 2022-02-23 DIAGNOSIS — S272XXA Traumatic hemopneumothorax, initial encounter: Secondary | ICD-10-CM

## 2022-02-23 LAB — CBC WITH DIFF
BASOPHIL #: <0.1 10*3/uL (ref <=0.20)
BASOPHIL %: 1 %
EOSINOPHIL #: 0.15 10*3/uL (ref <=0.50)
EOSINOPHIL %: 3 %
HCT: 35.4 % — ABNORMAL LOW (ref 38.9–52.0)
HGB: 12 g/dL — ABNORMAL LOW (ref 13.4–17.5)
IMMATURE GRANULOCYTE #: <0.1 10*3/uL (ref <0.10)
IMMATURE GRANULOCYTE %: 1 % (ref 0–1)
LYMPHOCYTE #: 1.17 10*3/uL (ref 1.00–4.80)
LYMPHOCYTE %: 22 %
MCH: 31.8 pg (ref 26.0–32.0)
MCHC: 33.9 g/dL (ref 31.0–35.5)
MCV: 93.9 fL (ref 78.0–100.0)
MONOCYTE #: 0.61 10*3/uL (ref 0.20–1.10)
MONOCYTE %: 11 %
MPV: 9.8 fL (ref 8.7–12.5)
NEUTROPHIL #: 3.34 10*3/uL (ref 1.50–7.70)
NEUTROPHIL %: 62 %
PLATELETS: 181 10*3/uL (ref 150–400)
RBC: 3.77 10*6/uL — ABNORMAL LOW (ref 4.50–6.10)
RDW-CV: 12.6 % (ref 11.5–15.5)
WBC: 5.4 10*3/uL (ref 3.7–11.0)

## 2022-02-23 LAB — BASIC METABOLIC PANEL
ANION GAP: 6 mmol/L (ref 4–13)
BUN/CREA RATIO: 15 (ref 6–22)
BUN: 13 mg/dL (ref 8–25)
CALCIUM: 8.1 mg/dL — ABNORMAL LOW (ref 8.8–10.2)
CHLORIDE: 101 mmol/L (ref 96–111)
CO2 TOTAL: 30 mmol/L (ref 23–31)
CREATININE: 0.84 mg/dL (ref 0.75–1.35)
ESTIMATED GFR: >90 mL/min/BSA (ref >=60)
GLUCOSE: 135 mg/dL — ABNORMAL HIGH (ref 65–125)
POTASSIUM: 4 mmol/L (ref 3.5–5.1)
SODIUM: 137 mmol/L (ref 136–145)

## 2022-02-23 MED ORDER — METHOCARBAMOL 500 MG TABLET
500.0000 mg | ORAL_TABLET | Freq: Four times a day (QID) | ORAL | 0 refills | Status: DC
Start: 2022-02-23 — End: 2022-03-04
  Filled 2022-02-23: qty 28, 7d supply, fill #0

## 2022-02-23 MED ORDER — ACETAMINOPHEN 325 MG TABLET
650.0000 mg | ORAL_TABLET | Freq: Four times a day (QID) | ORAL | 0 refills | Status: AC
Start: 2022-02-23 — End: ?

## 2022-02-23 MED ORDER — SENNOSIDES 8.6 MG-DOCUSATE SODIUM 50 MG TABLET
1.0000 | ORAL_TABLET | Freq: Two times a day (BID) | ORAL | 0 refills | Status: AC
Start: 2022-02-23 — End: 2022-03-09
  Filled 2022-02-23: qty 28, 14d supply, fill #0

## 2022-02-23 MED ORDER — GABAPENTIN 300 MG CAPSULE
300.0000 mg | ORAL_CAPSULE | Freq: Three times a day (TID) | ORAL | 0 refills | Status: DC
Start: 2022-02-23 — End: 2022-03-04
  Filled 2022-02-23: qty 21, 7d supply, fill #0

## 2022-02-23 MED ORDER — OXYCODONE 5 MG TABLET
5.0000 mg | ORAL_TABLET | ORAL | 0 refills | Status: AC | PRN
Start: 2022-02-23 — End: 2022-03-02
  Filled 2022-02-23: qty 40, 7d supply, fill #0

## 2022-02-23 NOTE — Nurses Notes (Signed)
Reviewed AVS with patient. Patient verbalized understanding of all discharge teaching. Encouraged to ask questions throughout education. PIV removed. Medications delivered to pt from discharge pharmacy. Discharged to lobby via wheelchair with belongings.
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
## 2022-02-23 NOTE — Respiratory Therapy (Signed)
02/23/22 0730   Pre-Tx   Start Time 0730   FVC   1st Attempt 2.49 Liters   2nd Attempt 2.68 Liters   3rd Attempt 2.5 Liters   Average FVC 2.6 Liters   NIF   1st Attempt -60 cmH2O   2nd Attempt -60 cmH2O   3rd Attempt -60 cmH2O   Average NIF -60 cmH2O   Patient Effort   Respiratory Effort Good   $Parameters (Resp only) Completed   Stop Time 0735   Duration 5 Minutes
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
## 2022-02-26 ENCOUNTER — Ambulatory Visit (INDEPENDENT_AMBULATORY_CARE_PROVIDER_SITE_OTHER): Payer: Self-pay

## 2022-02-26 NOTE — Telephone Encounter (Signed)
Discharge follow up phone call to patient. Patient states things are going well at home. Patient reports he is getting back to normal. Patient reports adequate pain control. Patient reports continued use of his IS and PEP valve at home. Patient denies fever, chills, SOB or chest pain. Patient reports normal appetite. Patient denies difficulty with bowel or bladder. Patient denies any questions or concerns. Patient educated on reasons to seek medical treatment. Patient advised to call with questions or concerns. Patient verbalized understanding.      Claudia Pollock, RN

## 2022-03-04 ENCOUNTER — Inpatient Hospital Stay (HOSPITAL_BASED_OUTPATIENT_CLINIC_OR_DEPARTMENT_OTHER)
Admission: RE | Admit: 2022-03-04 | Discharge: 2022-03-04 | Disposition: A | Discharge status: Home / Self Care | Payer: Worker's Comp, Other unspecified | Source: Ambulatory Visit

## 2022-03-04 ENCOUNTER — Encounter (INDEPENDENT_AMBULATORY_CARE_PROVIDER_SITE_OTHER): Payer: Self-pay

## 2022-03-04 ENCOUNTER — Ambulatory Visit: Payer: Worker's Comp, Other unspecified | Attending: Surgical Critical Care | Admitting: Family

## 2022-03-04 ENCOUNTER — Other Ambulatory Visit: Payer: Self-pay

## 2022-03-04 VITALS — BP 152/95 | HR 81 | Temp 97.7°F | Ht 69.0 in | Wt 229.3 lb

## 2022-03-04 DIAGNOSIS — S2239XA Fracture of one rib, unspecified side, initial encounter for closed fracture: Secondary | ICD-10-CM | POA: Insufficient documentation

## 2022-03-04 DIAGNOSIS — S2231XA Fracture of one rib, right side, initial encounter for closed fracture: Secondary | ICD-10-CM

## 2022-03-04 MED ORDER — OXYCODONE 5 MG TABLET
5.0000 mg | ORAL_TABLET | Freq: Four times a day (QID) | ORAL | 0 refills | Status: AC | PRN
Start: 2022-03-04 — End: 2022-03-11

## 2022-03-04 MED ORDER — GABAPENTIN 300 MG CAPSULE
300.0000 mg | ORAL_CAPSULE | Freq: Three times a day (TID) | ORAL | 0 refills | Status: DC
Start: 2022-03-04 — End: 2022-03-18

## 2022-03-04 MED ORDER — METHOCARBAMOL 500 MG TABLET
500.0000 mg | ORAL_TABLET | Freq: Four times a day (QID) | ORAL | 0 refills | Status: AC
Start: 2022-03-04 — End: 2022-03-11

## 2022-03-05 ENCOUNTER — Other Ambulatory Visit (INDEPENDENT_AMBULATORY_CARE_PROVIDER_SITE_OTHER): Payer: Self-pay | Admitting: Family

## 2022-03-05 DIAGNOSIS — S2241XA Multiple fractures of ribs, right side, initial encounter for closed fracture: Secondary | ICD-10-CM

## 2022-03-05 DIAGNOSIS — T797XXA Traumatic subcutaneous emphysema, initial encounter: Secondary | ICD-10-CM

## 2022-03-12 ENCOUNTER — Other Ambulatory Visit (HOSPITAL_COMMUNITY): Payer: Self-pay | Admitting: PHYSICIAN ASSISTANT

## 2022-03-12 MED ORDER — GABAPENTIN 100 MG CAPSULE
200.0000 mg | ORAL_CAPSULE | Freq: Three times a day (TID) | ORAL | 0 refills | Status: DC
Start: 2022-03-12 — End: 2022-03-18

## 2022-03-12 MED ORDER — METHOCARBAMOL 500 MG TABLET
750.0000 mg | ORAL_TABLET | Freq: Four times a day (QID) | ORAL | 0 refills | Status: DC
Start: 2022-03-12 — End: 2022-03-18
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
## 2022-03-18 ENCOUNTER — Encounter (INDEPENDENT_AMBULATORY_CARE_PROVIDER_SITE_OTHER): Payer: Self-pay

## 2022-03-18 ENCOUNTER — Other Ambulatory Visit: Payer: Self-pay

## 2022-03-18 ENCOUNTER — Ambulatory Visit: Payer: Worker's Comp, Other unspecified | Attending: Family | Admitting: Family

## 2022-03-18 ENCOUNTER — Inpatient Hospital Stay (HOSPITAL_BASED_OUTPATIENT_CLINIC_OR_DEPARTMENT_OTHER)
Admission: RE | Admit: 2022-03-18 | Discharge: 2022-03-18 | Disposition: A | Discharge status: Home / Self Care | Payer: Worker's Comp, Other unspecified | Source: Ambulatory Visit

## 2022-03-18 VITALS — BP 133/92 | HR 89 | Temp 97.2°F | Ht 69.0 in | Wt 230.4 lb

## 2022-03-18 DIAGNOSIS — S2239XA Fracture of one rib, unspecified side, initial encounter for closed fracture: Secondary | ICD-10-CM | POA: Insufficient documentation

## 2022-03-18 DIAGNOSIS — S2231XA Fracture of one rib, right side, initial encounter for closed fracture: Secondary | ICD-10-CM

## 2022-03-18 DIAGNOSIS — J939 Pneumothorax, unspecified: Secondary | ICD-10-CM | POA: Insufficient documentation

## 2022-03-18 DIAGNOSIS — M25511 Pain in right shoulder: Secondary | ICD-10-CM | POA: Insufficient documentation

## 2022-03-18 MED ORDER — GABAPENTIN 300 MG CAPSULE
300.0000 mg | ORAL_CAPSULE | Freq: Three times a day (TID) | ORAL | 0 refills | Status: AC
Start: 2022-03-18 — End: 2022-03-25

## 2022-03-18 MED ORDER — METHOCARBAMOL 750 MG TABLET
750.0000 mg | ORAL_TABLET | Freq: Four times a day (QID) | ORAL | 0 refills | Status: AC
Start: 2022-03-18 — End: 2022-03-25
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
## 2023-03-26 ENCOUNTER — Other Ambulatory Visit (INDEPENDENT_AMBULATORY_CARE_PROVIDER_SITE_OTHER): Payer: Self-pay | Admitting: Family
